# Patient Record
Sex: Male | Born: 1965 | Race: White | Hispanic: No | Marital: Married | Smoking: Never smoker
Health system: Southern US, Community
[De-identification: ages and names within clinical notes are randomized; demographics above are authoritative.]

## PROBLEM LIST (undated history)

## (undated) DIAGNOSIS — R21 Rash and other nonspecific skin eruption: Secondary | ICD-10-CM

---

## 2014-02-07 ENCOUNTER — Emergency Department (HOSPITAL_COMMUNITY)
Admission: EM | Admit: 2014-02-07 | Discharge: 2014-02-08 | Discharge status: Against Medical Advice | Payer: 59 | Attending: Emergency Medicine | Admitting: Emergency Medicine

## 2014-02-07 ENCOUNTER — Encounter (HOSPITAL_COMMUNITY): Payer: Self-pay | Admitting: Emergency Medicine

## 2014-02-07 DIAGNOSIS — D72819 Decreased white blood cell count, unspecified: Secondary | ICD-10-CM | POA: Insufficient documentation

## 2014-02-07 DIAGNOSIS — D649 Anemia, unspecified: Secondary | ICD-10-CM | POA: Insufficient documentation

## 2014-02-07 DIAGNOSIS — Z79899 Other long term (current) drug therapy: Secondary | ICD-10-CM | POA: Insufficient documentation

## 2014-02-07 HISTORY — DX: Rash and other nonspecific skin eruption: R21

## 2014-02-07 LAB — HEMOGLOBIN AND HEMATOCRIT, BLOOD
HEMATOCRIT: 22.9 % — AB (ref 39.0–52.0)
HEMOGLOBIN: 7.6 g/dL — AB (ref 13.0–17.0)

## 2014-02-07 NOTE — ED Provider Notes (Signed)
CSN: [id number]     Arrival date & time 02/07/14  2114 History   First MD Initiated Contact with Patient 02/07/14 2324     Chief Complaint  Patient presents with  . Anemia     (Consider location/radiation/quality/duration/timing/severity/associated sxs/[facility]) The history is provided by the patient. No language interpreter was used.  Bruce Harper is a 48 y/o M with no significant PMHx presenting to the ED due to being called by [facility] yesterday for assessment of a rash. Patient reported that he received a phone call at 8:00PM this evening and stated that he needed to come to the ED to be assessed regarding low Hgb. Patient reported that for the past couple weeks he's been having mild fatigue after strenuous activity. Denied chest pain, shortness of breath, difficulty breathing, diaphoresis, melena, hematochezia, weakness, hematuria, dizziness, headache, blurred vision, sudden loss of vision, syncope, loss of sensation. PCP none   Past Medical History  Diagnosis Date  . Rash    History reviewed. No pertinent past surgical history. No family history on file. History  Substance Use Topics  . Smoking status: Never Smoker   . Smokeless tobacco: Never Used  . Alcohol Use: Yes     Comment: occasionally    Review of Systems  Constitutional: Positive for fatigue. Negative for fever and chills.  Respiratory: Negative for chest tightness and shortness of breath.   Cardiovascular: Negative for chest pain.  Neurological: Negative for dizziness, syncope, weakness and numbness.      Allergies  Review of patient's allergies indicates no known allergies.  Home Medications   [place] to Admission medications   Medication Sig Start Date End Date Taking? Authorizing Provider  ciclopirox (LOPROX) 0.77 % cream Apply 1 application topically 2 (two) times daily. 01/30/14  Yes Historical Provider, MD  HYDROcodone-acetaminophen (NORCO) 5-325 MG per tablet Take 1 tablet by  mouth every 6 (six) hours as needed for moderate pain.   Yes Historical Provider, MD  ibuprofen (ADVIL,MOTRIN) 200 MG tablet Take 600-800 mg by mouth every 6 (six) hours as needed for moderate pain.   Yes Historical Provider, MD  sulfamethoxazole-trimethoprim (BACTRIM DS) 800-160 MG per tablet Take 1 tablet by mouth 2 (two) times daily.   Yes Historical Provider, MD  terbinafine (LAMISIL) 250 MG tablet Take 250 mg by mouth daily.   Yes Historical Provider, MD   BP 131/72  Pulse 69  Temp(Src) 97.7 F (36.5 C) (Oral)  Resp 18  SpO2 100% Physical Exam  Nursing note and vitals reviewed. Constitutional: He is oriented to person, place, and time. He appears well-developed and well-nourished. No distress.  HENT:  Head: Normocephalic and atraumatic.  Mouth/Throat: Oropharynx is clear and moist. No oropharyngeal exudate.  Pale buccal mucosa   Eyes: Conjunctivae and EOM are normal. Pupils are equal, round, and reactive to light. Right eye exhibits no discharge. Left eye exhibits no discharge.  Pale palpebral conjunctiva  Neck: Normal range of motion. Neck supple. No tracheal deviation present.  Cardiovascular: Normal rate, regular rhythm and normal heart sounds.  Exam reveals no friction rub.   No murmur heard. Pulses:      Radial pulses are 2+ on the right side, and 2+ on the left side.  Cap refill < 3 seconds  Pulmonary/Chest: Effort normal and breath sounds normal. No respiratory distress. He has no wheezes. He has no rales. He exhibits no tenderness.  Genitourinary:  Rectal exam: negative swelling, erythema, inflammation, lesions, sores, deformities, hemorrhoids. Negative palpation of masses/polyps.  Strong sphincter tone. Brown stool noted on glove. Negative blood on glove Exam chaperoned with tech.  Musculoskeletal: Normal range of motion. He exhibits no edema and no tenderness.  Full ROM to upper and lower extremities without difficulty noted, negative ataxia noted.  Lymphadenopathy:     He has no cervical adenopathy.  Neurological: He is alert and oriented to person, place, and time. No cranial nerve deficit. He exhibits normal muscle tone. Coordination normal.  Cranial nerves III-XII grossly intact Strength 5+/5+ to upper and lower extremities bilaterally with resistance applied, equal distribution noted Equal grip strength bilaterally Negative facial droop Next her speech Negative aphasia Negative arm drift Fine motor skills intact Gait proper, proper balance - negative sway, negative drift, negative step-offs  Skin: Skin is warm and dry. He is not diaphoretic. There is pallor.  Psychiatric: He has a normal mood and affect. His behavior is normal. Thought content normal.    ED Course  Procedures (including critical care time)  Results for orders placed during the hospital encounter of 02/07/14  HEMOGLOBIN AND HEMATOCRIT, BLOOD      Result Value Ref Range   Hemoglobin 7.6 (*) 13.0 - 17.0 g/dL   HCT 40.9 (*) 81.1 - 91.4 %  CBC WITH DIFFERENTIAL      Result Value Ref Range   WBC 3.4 (*) 4.0 - 10.5 K/uL   RBC 1.94 (*) 4.22 - 5.81 MIL/uL   Hemoglobin 7.6 (*) 13.0 - 17.0 g/dL   HCT 78.2 (*) 95.6 - 21.3 %   MCV 116.0 (*) 78.0 - 100.0 fL   MCH 39.2 (*) 26.0 - 34.0 pg   MCHC 33.8  30.0 - 36.0 g/dL   RDW 08.6 (*) 57.8 - 46.9 %   Platelets 187  150 - 400 K/uL   Neutrophils Relative % 22 (*) 43 - 77 %   Lymphocytes Relative 60 (*) 12 - 46 %   Monocytes Relative 18 (*) 3 - 12 %   Eosinophils Relative 0  0 - 5 %   Basophils Relative 0  0 - 1 %   Neutro Abs 0.7 (*) 1.7 - 7.7 K/uL   Lymphs Abs 2.1  0.7 - 4.0 K/uL   Monocytes Absolute 0.6  0.1 - 1.0 K/uL   Eosinophils Absolute 0.0  0.0 - 0.7 K/uL   Basophils Absolute 0.0  0.0 - 0.1 K/uL   WBC Morphology ATYPICAL LYMPHOCYTES    COMPREHENSIVE METABOLIC PANEL      Result Value Ref Range   Sodium 139  137 - 147 mEq/L   Potassium 4.0  3.7 - 5.3 mEq/L   Chloride 103  96 - 112 mEq/L   CO2 22  19 - 32 mEq/L   Glucose,  Bld 110 (*) 70 - 99 mg/dL   BUN 12  6 - 23 mg/dL   Creatinine, Ser 6.29  0.50 - 1.35 mg/dL   Calcium 9.4  8.4 - 52.8 mg/dL   Total Protein 7.9  6.0 - 8.3 g/dL   Albumin 4.0  3.5 - 5.2 g/dL   AST 16  0 - 37 U/L   ALT 19  0 - 53 U/L   Alkaline Phosphatase 65  39 - 117 U/L   Total Bilirubin <0.2 (*) 0.3 - 1.2 mg/dL   GFR calc non Af Amer 84 (*) >90 mL/min   GFR calc Af Amer >90  >90 mL/min   Anion gap 14  5 - 15  POC OCCULT BLOOD, ED      Result  Value Ref Range   Fecal Occult Bld NEGATIVE  NEGATIVE    Labs Review Labs Reviewed  HEMOGLOBIN AND HEMATOCRIT, BLOOD - Abnormal; Notable for the following:    Hemoglobin 7.6 (*)    HCT 22.9 (*)    All other components within normal limits  CBC WITH DIFFERENTIAL - Abnormal; Notable for the following:    WBC 3.4 (*)    RBC 1.94 (*)    Hemoglobin 7.6 (*)    HCT 22.5 (*)    MCV 116.0 (*)    MCH 39.2 (*)    RDW 16.0 (*)    Neutrophils Relative % 22 (*)    Lymphocytes Relative 60 (*)    Monocytes Relative 18 (*)    Neutro Abs 0.7 (*)    All other components within normal limits  COMPREHENSIVE METABOLIC PANEL - Abnormal; Notable for the following:    Glucose, Bld 110 (*)    Total Bilirubin <0.2 (*)    GFR calc non Af Amer 84 (*)    All other components within normal limits  OCCULT BLOOD X 1 CARD TO LAB, STOOL  PATHOLOGIST SMEAR REVIEW  POC OCCULT BLOOD, ED    Imaging Review Dg Chest 2 View  02/08/2014   CLINICAL DATA:  Anemia.  Cough  EXAM: CHEST  2 VIEW  COMPARISON:  None.  FINDINGS: Normal heart size and mediastinal contours. No acute infiltrate or edema. No effusion or pneumothorax. No acute osseous findings.  IMPRESSION: No active cardiopulmonary disease.   Electronically Signed   By: Tiburcio Pea M.D.   On: 02/08/2014 02:25     EKG Interpretation None      MDM   Final diagnoses:  Symptomatic anemia  WBC decreased    Medications  sodium chloride 0.9 % bolus 1,000 mL (not administered)   Filed Vitals:   02/07/14  2140 02/08/14 0233  BP: 143/73 131/72  Pulse: 61 69  Temp: 97.7 F (36.5 C)   TempSrc: Oral   Resp: 18 18  SpO2: 100% 100%   CBC mildly low-3.4. Elevated lymphocytes and monocytes. Atypical lymphocytes noted. Hemoglobin 7.6, red blood cells 1.94, hematocrit 22.5. CMP unremarkable. Fecal occult negative. Chest x-ray negative for acute cardiac pulmonary disease. Smear pending   Suspicion to be symptomatic anemia. Decreased WBC noted - concern regarding this new finding.   1:44 AM This provider had a long discussion with the patient regarding concern of paleness of skin, fatigue, low Hgb, and low WBC. Recommended admission, patient agreed.  Patient seen and assessed by attending physician, who also agrees with admission.   2:37 AM This provider was made aware that the patient did not want to stay anymore. This provider spoke with the patient. Patient reported that he is not from here, reported that he is from [place], [place] and would prefer to have work-up performed where he lives so that when he does get discharged he can follow-up as an outpatient. Patient reported that his wife is stressed out and that she is not here with him. Patient prefers to be home. Discussed with patient concern and patient understood. Patient reported that he will leave to go back to [facility]. Discussed consequences/dangers and patient understood. Discussed with patient that since he is declining admission he will have to sign AMA - patient understood. Discussed with patient to closely monitor symptoms and if symptoms are to worsen or change to report back to the ED - strict return instructions given.  Patient agreed to plan of care, understood,  all questions answered.   Raymon MuttonMarissa Nakaya Mishkin, PA-C 02/08/14 [phone]

## 2014-02-07 NOTE — ED Notes (Addendum)
[facility] They called him today and asked him to come to the ED to have his hemoglobin rechecked. Initial value was = 7.1. Pt denies any weakness or dizziness. Pt in NAD. Coloration WNL.

## 2014-02-08 ENCOUNTER — Emergency Department (HOSPITAL_COMMUNITY): Payer: 59

## 2014-02-08 LAB — COMPREHENSIVE METABOLIC PANEL
ALBUMIN: 4 g/dL (ref 3.5–5.2)
ALK PHOS: 65 U/L (ref 39–117)
ALT: 19 U/L (ref 0–53)
AST: 16 U/L (ref 0–37)
Anion gap: 14 (ref 5–15)
BUN: 12 mg/dL (ref 6–23)
CO2: 22 mEq/L (ref 19–32)
Calcium: 9.4 mg/dL (ref 8.4–10.5)
Chloride: 103 mEq/L (ref 96–112)
Creatinine, Ser: 1.04 mg/dL (ref 0.50–1.35)
GFR calc Af Amer: >90 mL/min (ref >90)
GFR calc non Af Amer: 84 mL/min — ABNORMAL LOW (ref >90)
Glucose, Bld: 110 mg/dL — ABNORMAL HIGH (ref 70–99)
POTASSIUM: 4 meq/L (ref 3.7–5.3)
SODIUM: 139 meq/L (ref 137–147)
TOTAL PROTEIN: 7.9 g/dL (ref 6.0–8.3)
Total Bilirubin: <0.2 mg/dL — ABNORMAL LOW (ref 0.3–1.2)

## 2014-02-08 LAB — CBC WITH DIFFERENTIAL/PLATELET
Basophils Absolute: 0 10*3/uL (ref 0.0–0.1)
Basophils Relative: 0 % (ref 0–1)
Eosinophils Absolute: 0 10*3/uL (ref 0.0–0.7)
Eosinophils Relative: 0 % (ref 0–5)
HCT: 22.5 % — ABNORMAL LOW (ref 39.0–52.0)
HEMOGLOBIN: 7.6 g/dL — AB (ref 13.0–17.0)
LYMPHS PCT: 60 % — AB (ref 12–46)
Lymphs Abs: 2.1 10*3/uL (ref 0.7–4.0)
MCH: 39.2 pg — AB (ref 26.0–34.0)
MCHC: 33.8 g/dL (ref 30.0–36.0)
MCV: 116 fL — ABNORMAL HIGH (ref 78.0–100.0)
MONO ABS: 0.6 10*3/uL (ref 0.1–1.0)
Monocytes Relative: 18 % — ABNORMAL HIGH (ref 3–12)
NEUTROS PCT: 22 % — AB (ref 43–77)
Neutro Abs: 0.7 10*3/uL — ABNORMAL LOW (ref 1.7–7.7)
PLATELETS: 187 10*3/uL (ref 150–400)
RBC: 1.94 MIL/uL — AB (ref 4.22–5.81)
RDW: 16 % — ABNORMAL HIGH (ref 11.5–15.5)
WBC: 3.4 10*3/uL — ABNORMAL LOW (ref 4.0–10.5)

## 2014-02-08 LAB — PATHOLOGIST SMEAR REVIEW

## 2014-02-08 LAB — POC OCCULT BLOOD, ED: FECAL OCCULT BLD: NEGATIVE

## 2014-02-08 MED ORDER — SODIUM CHLORIDE 0.9 % IV BOLUS (SEPSIS): 1000.0000 mL | Freq: Once | INTRAVENOUS | Status: DC

## 2014-02-08 NOTE — ED Notes (Signed)
Patient expresses he wishes to not be admitted. Provider made aware of same. Patient to sign AMA. Patient verbalizes understanding.

## 2014-02-08 NOTE — ED Provider Notes (Signed)
Medical screening examination/treatment/procedure(s) were conducted as a shared visit with non-physician practitioner(s) or resident and myself. I personally evaluated the patient during the encounter and agree with the findings.  I have personally reviewed any xrays and/ or EKG's with the provider and I agree with interpretation.  Patient presents with fatigue worse with exertion worsening the past 3 weeks. Patient has been on multiple different antibiotics for nonspecific skin rashes and also antifungal recently. Patient did have one or 2 days low-grade fever 3 weeks ago. Patient has had rash on neck arm and feet bilateral. No known tick bites and no recent fevers. Patient finished doxycycline and. Patient was sent over for further evaluation for new-onset anemia. No known cancer history or weight loss. Patient did have a mild lump left axilla region. On exam patient well-appearing no distress, mild dry mucous membranes, pale conjunctiva, mild pedal skin, few erythematous nonspecific macules and papules on left dorsal toes, no purpura, supple neck, abd soft NT. Pt denies night sweats or wt loss.  Pt admitted for further evaluation. Pt changed his mind, prefers outpatient follow up, patient has capacity to make decisions. Discussed differential with patient.  Labs Reviewed   HEMOGLOBIN AND HEMATOCRIT, BLOOD - Abnormal; Notable for the following:    Hemoglobin  7.6 (*)     HCT  22.9 (*)     All other components within normal limits   CBC WITH DIFFERENTIAL - Abnormal; Notable for the following:    WBC  3.4 (*)     RBC  1.94 (*)     Hemoglobin  7.6 (*)     HCT  22.5 (*)     MCV  116.0 (*)     MCH  39.2 (*)     RDW  16.0 (*)     Neutrophils Relative %  22 (*)     Lymphocytes Relative  60 (*)     Monocytes Relative  18 (*)     Neutro Abs  0.7 (*)     All other components within normal limits   COMPREHENSIVE METABOLIC PANEL - Abnormal; Notable for the following:    Glucose, Bld  110 (*)     Total  Bilirubin  <0.2 (*)     GFR calc non Af Amer  84 (*)     All other components within normal limits   OCCULT BLOOD X 1 CARD TO LAB, STOOL   PATHOLOGIST SMEAR REVIEW   POC OCCULT BLOOD, ED    Symptomatic anemia, Leukopenia    Enid SkeensJoshua M Tyerra Loretto, MD 02/08/14 [phone]

## 2014-02-08 NOTE — Discharge Instructions (Signed)
Please call your doctor for a followup appointment within 24-48 hours. When you talk to your doctor please let them know that you were seen in the emergency department and have them acquire all of your records so that they can discuss the findings with you and formulate a treatment plan to fully care for your new and ongoing problems. Please rest and stay hydrated  Please continue to monitor symptoms closely and if symptoms are to worsen or change (fever greater than 101, chills, sweating, nausea, vomiting, chest pain, shortness of breathe, difficulty breathing, weakness, numbness, tingling, worsening or changes to pain pattern, fainting, loss of sensation, dizziness, headache, blurred vision, sudden loss of vision, blood in the stools, black tarry stools) please report back to the Emergency Department immediately.    Anemia, Nonspecific Anemia is a condition in which the concentration of red blood cells or hemoglobin in the blood is below normal. Hemoglobin is a substance in red blood cells that carries oxygen to the tissues of the body. Anemia results in not enough oxygen reaching these tissues.  CAUSES  Common causes of anemia include:   Excessive bleeding. Bleeding may be internal or external. This includes excessive bleeding from periods (in women) or from the intestine.   Poor nutrition.   Chronic kidney, thyroid, and liver disease.  Bone marrow disorders that decrease red blood cell production.  Cancer and treatments for cancer.  HIV, AIDS, and their treatments.  Spleen problems that increase red blood cell destruction.  Blood disorders.  Excess destruction of red blood cells due to infection, medicines, and autoimmune disorders. SIGNS AND SYMPTOMS   Minor weakness.   Dizziness.   Headache.  Palpitations.   Shortness of breath, especially with exercise.   Paleness.  Cold sensitivity.  Indigestion.  Nausea.  Difficulty sleeping.  Difficulty  concentrating. Symptoms may occur suddenly or they may develop slowly.  DIAGNOSIS  Additional blood tests are often needed. These help your health care provider determine the best treatment. Your health care provider will check your stool for blood and look for other causes of blood loss.  TREATMENT  Treatment varies depending on the cause of the anemia. Treatment can include:   Supplements of iron, vitamin B12, or folic acid.   Hormone medicines.   A blood transfusion. This may be needed if blood loss is severe.   Hospitalization. This may be needed if there is significant continual blood loss.   Dietary changes.  Spleen removal. HOME CARE INSTRUCTIONS Keep all follow-up appointments. It often takes many weeks to correct anemia, and having your health care provider check on your condition and your response to treatment is very important. SEEK IMMEDIATE MEDICAL CARE IF:   You develop extreme weakness, shortness of breath, or chest pain.   You become dizzy or have trouble concentrating.  You develop heavy vaginal bleeding.   You develop a rash.   You have bloody or black, tarry stools.   You faint.   You vomit up blood.   You vomit repeatedly.   You have abdominal pain.  You have a fever or persistent symptoms for more than 2-3 days.   You have a fever and your symptoms suddenly get worse.   You are dehydrated.  MAKE SURE YOU:  Understand these instructions.  Will watch your condition.  Will get help right away if you are not doing well or get worse. Document Released: 07/16/2004 Document Revised: 02/08/2013 Document Reviewed: 12/02/2012 [facility] Patient Information 2015 [employer] [place]. This information is not  intended to replace advice given to you by your health care provider. Make sure you discuss any questions you have with your health care provider.

## 2015-10-05 IMAGING — CR DG CHEST 2V
4 series · 4 of 4 positions shown · non-contrast
Comparison: None.

CLINICAL DATA: Anemia.  Cough

EXAM:
CHEST  2 VIEW

[w chest pa (1 of 2)]
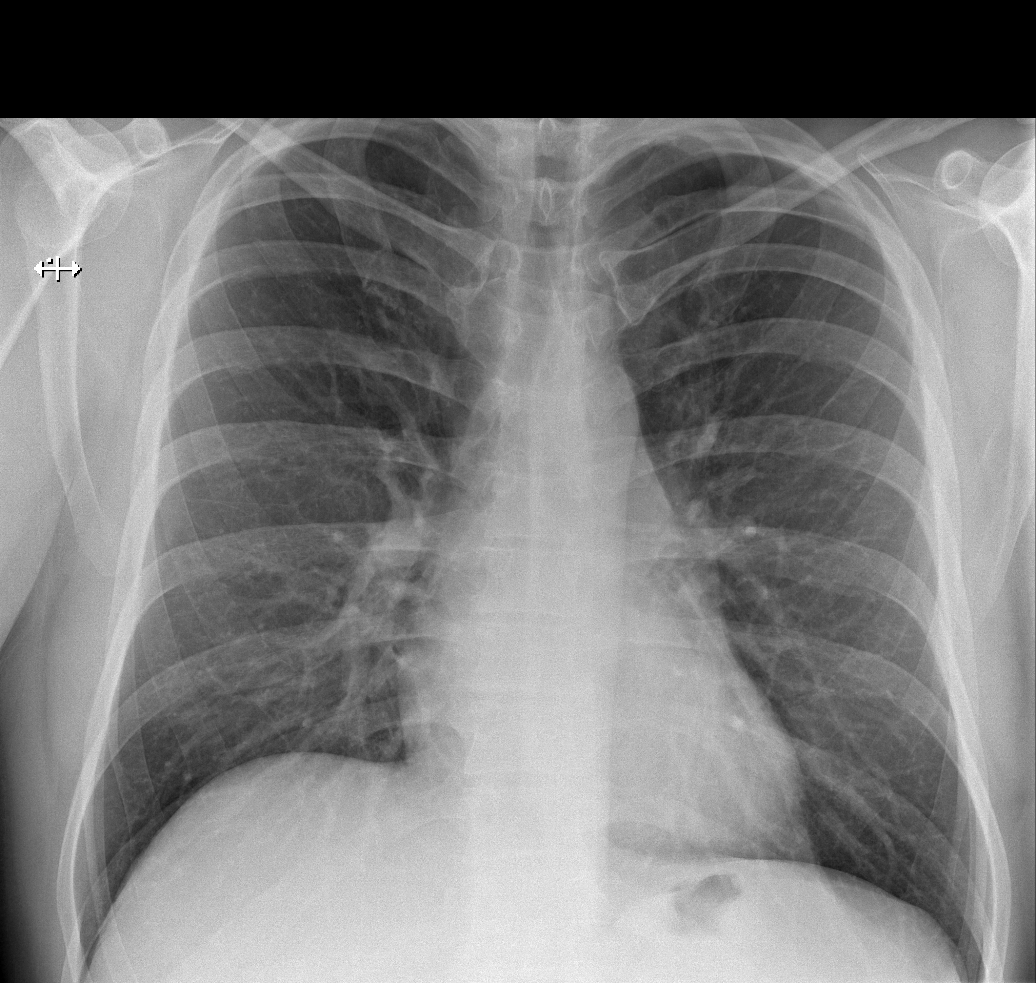

[w chest lat (1 of 2)]
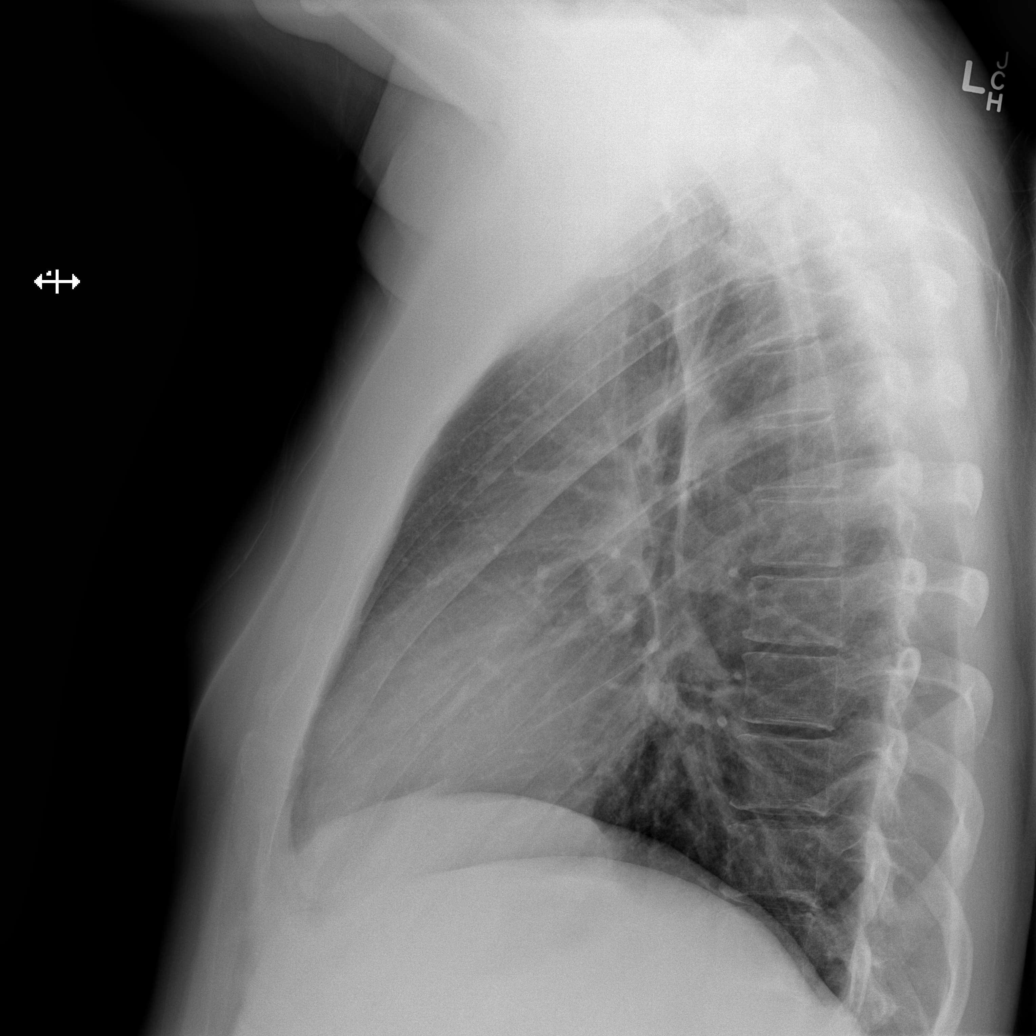

[w chest lat (2 of 2)]
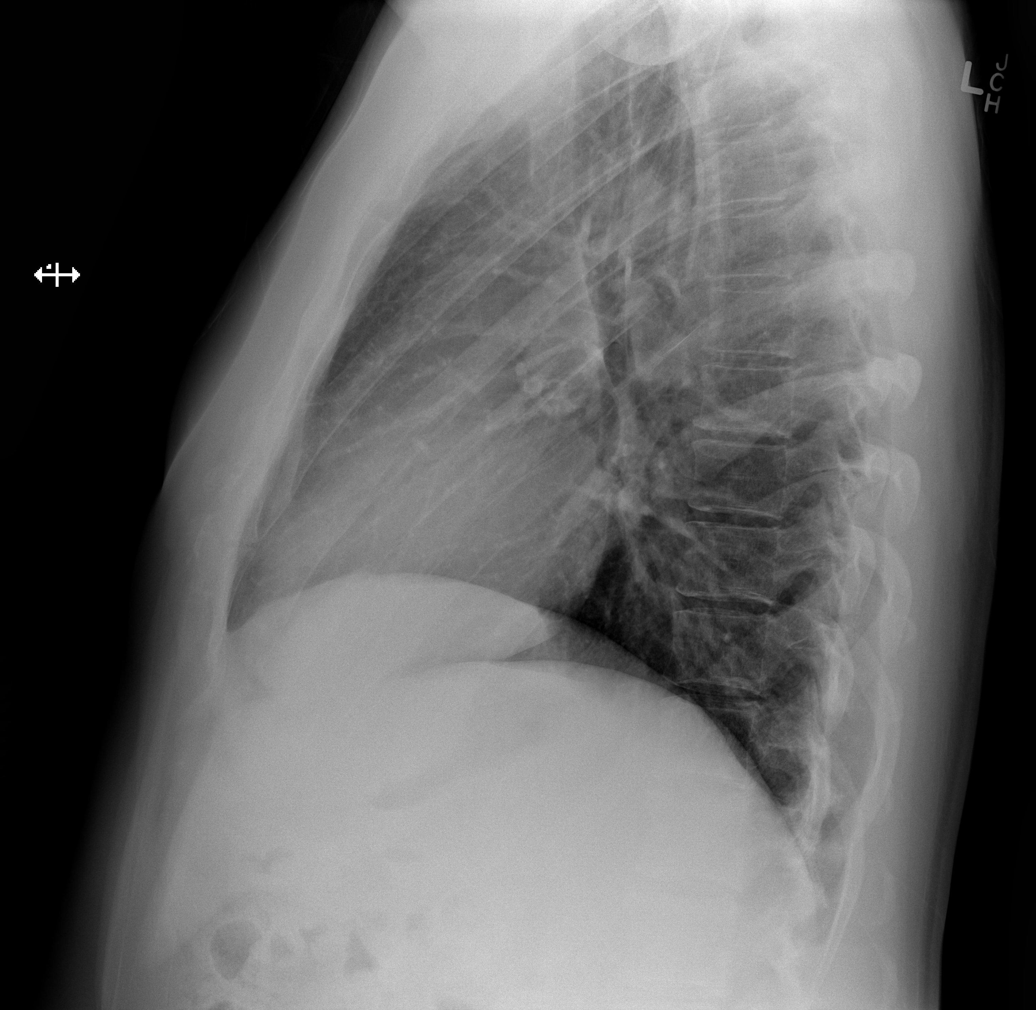

[w chest pa (2 of 2)]
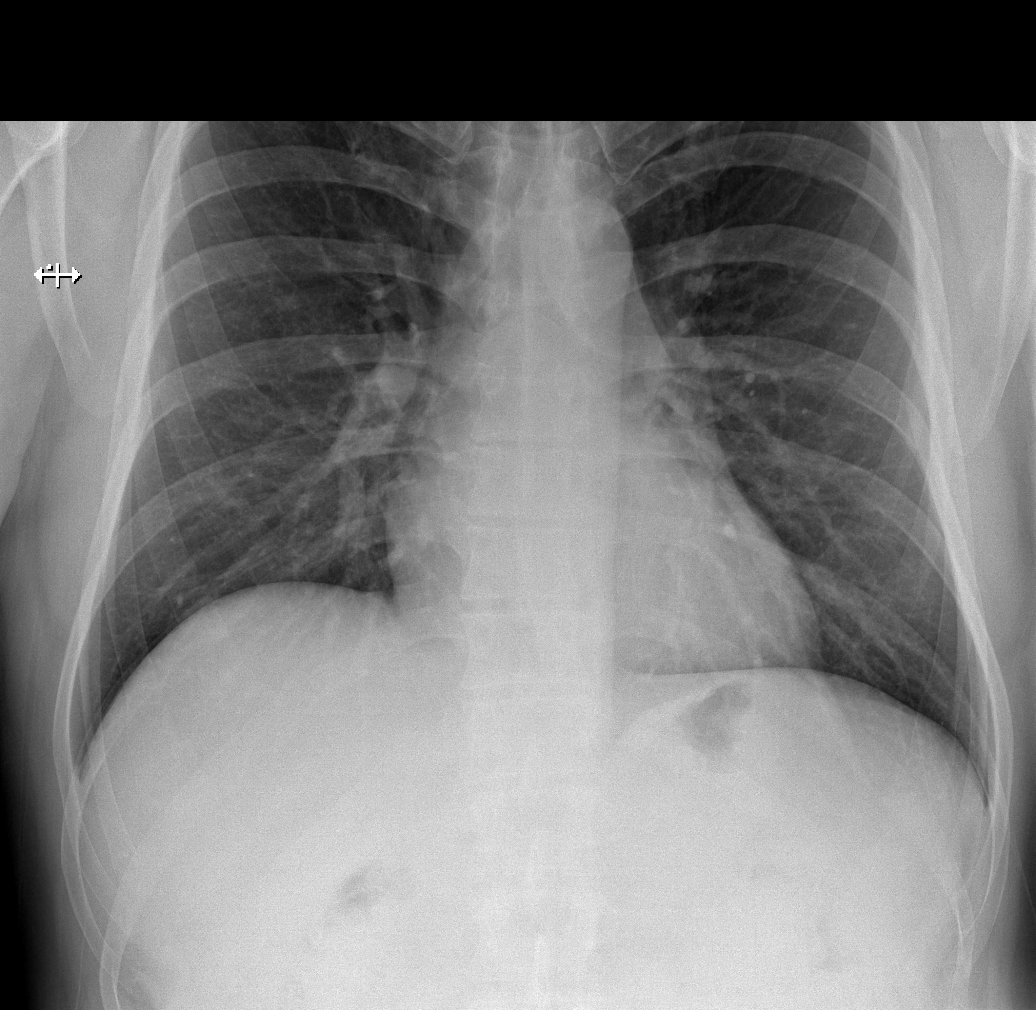

[4 of 4 positions shown; findings below may reference images not displayed]

FINDINGS: Normal heart size and mediastinal contours. No acute infiltrate or
edema. No effusion or pneumothorax. No acute osseous findings.
IMPRESSION: No active cardiopulmonary disease.

## 2018-09-21 DEATH — deceased
